# Patient Record
Sex: Male | Born: 2000 | Race: Black or African American | Hispanic: No | Marital: Single | Smoking: Never smoker
Health system: Southern US, Community
[De-identification: ages and names within clinical notes are randomized; demographics above are authoritative.]

## PROBLEM LIST (undated history)

## (undated) DIAGNOSIS — Z789 Other specified health status: Secondary | ICD-10-CM

## (undated) HISTORY — PX: NO PAST SURGERIES: SHX2092

---
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
## 2018-08-25 IMAGING — US US ABDOMEN LIMITED
1 series · 14 of 25 positions shown · non-contrast
Comparison: None.

CLINICAL DATA: Elevated liver enzymes

EXAM:
ULTRASOUND ABDOMEN LIMITED RIGHT UPPER QUADRANT

[Series 1: us abdomen limited · 0.20mm/px · 14 of 41 slices shown]
[im 1/41]
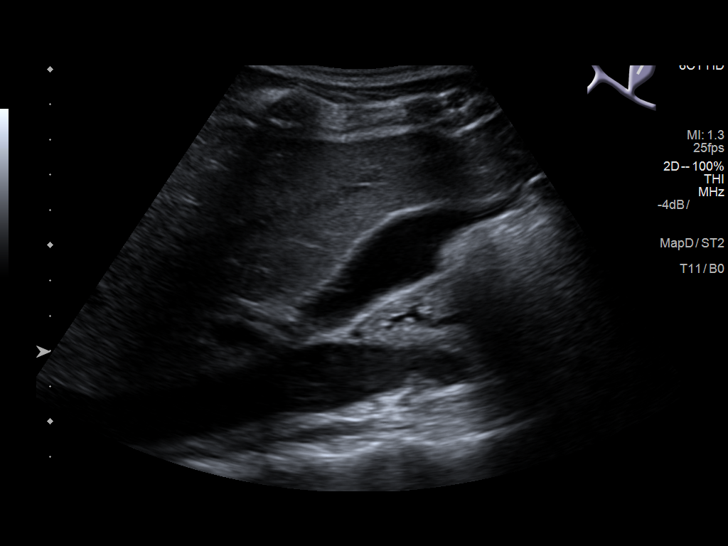
[im 4/41]
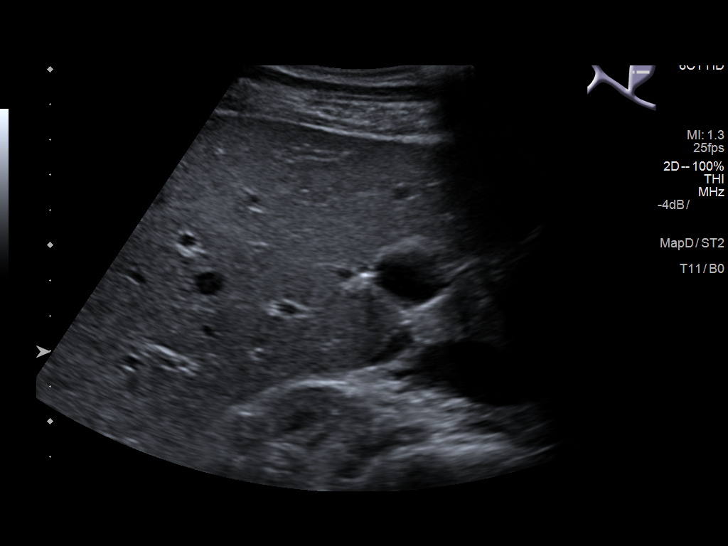
[im 7/41]
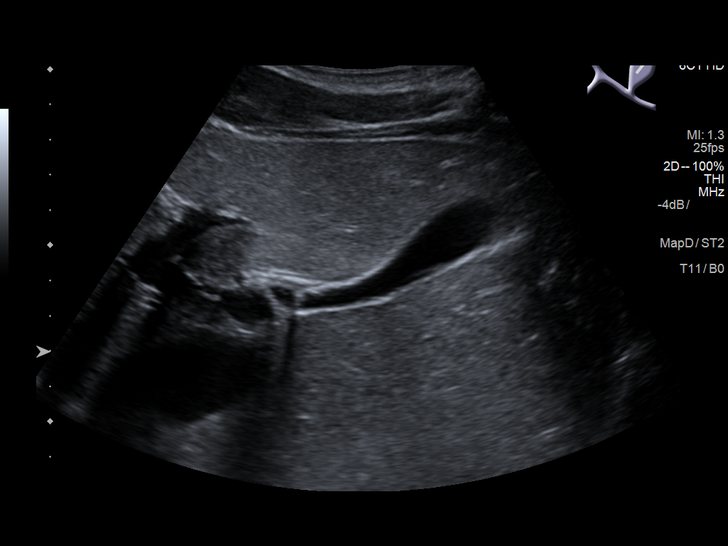
[im 11/41]
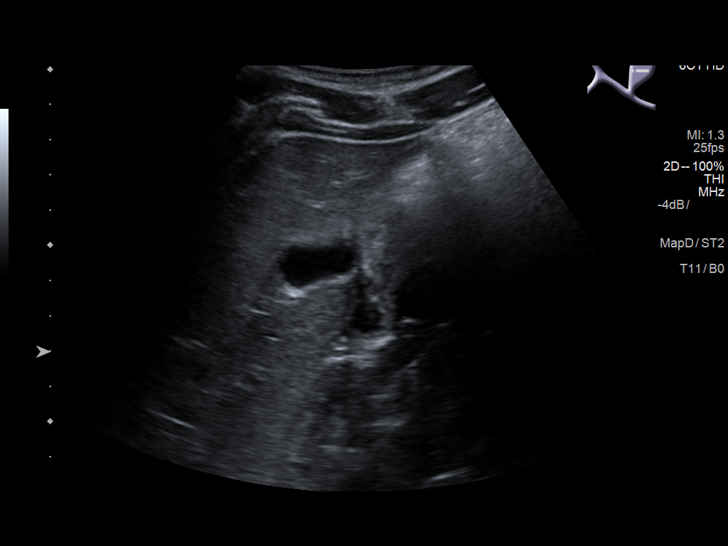
[im 14/41]
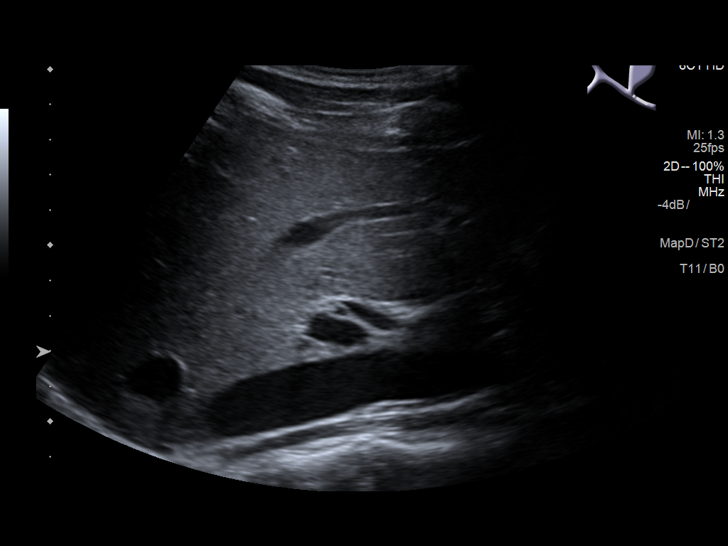
[im 16/41]
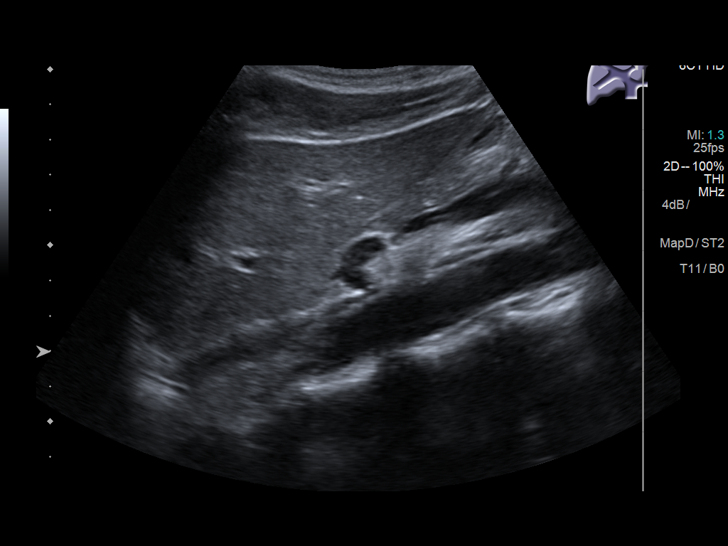
[im 19/41]
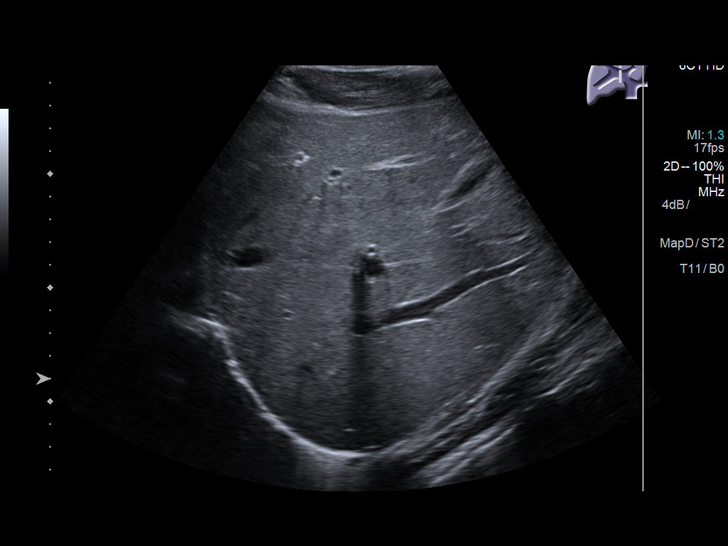
[im 22/41]
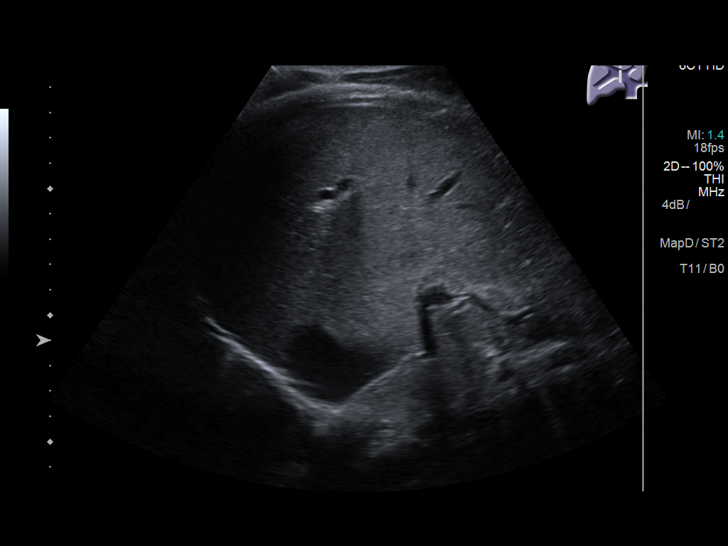
[im 26/41]
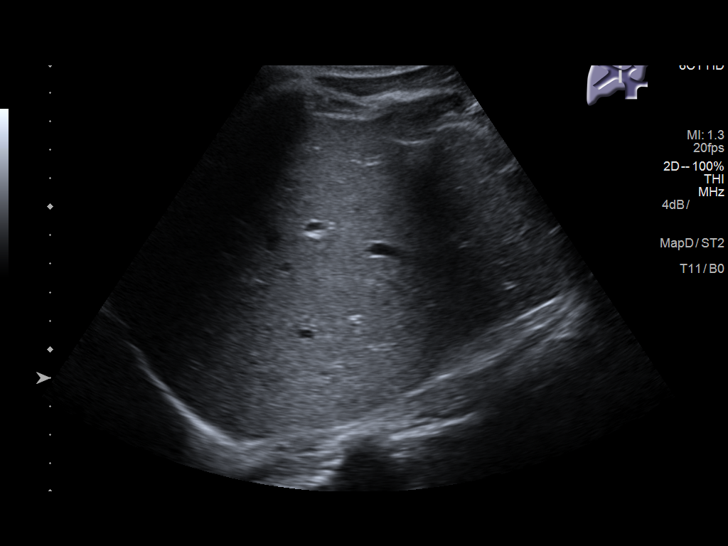
[im 27/41]
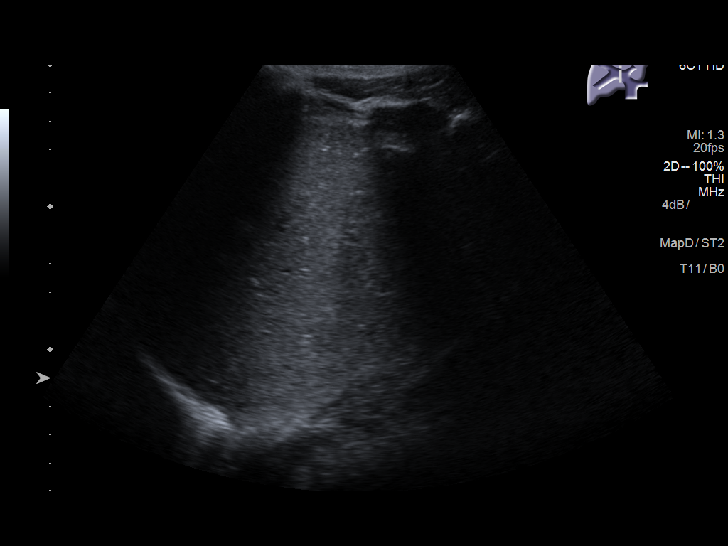
[im 31/41]
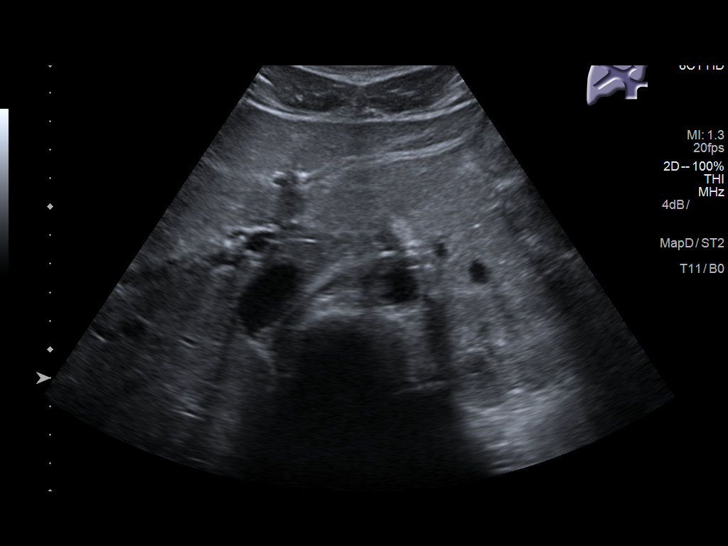
[im 34/41]
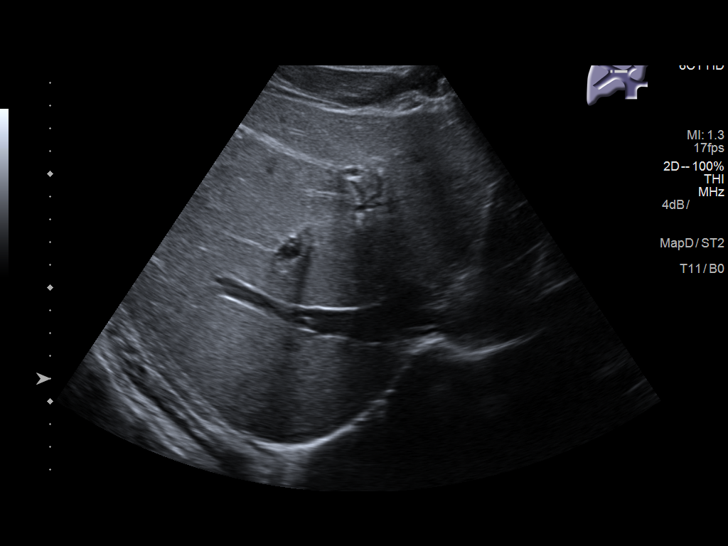
[im 37/41]
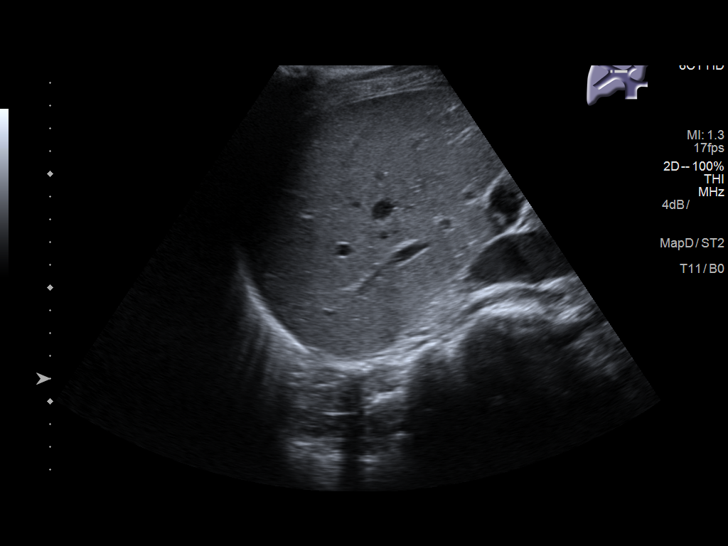
[im 41/41]
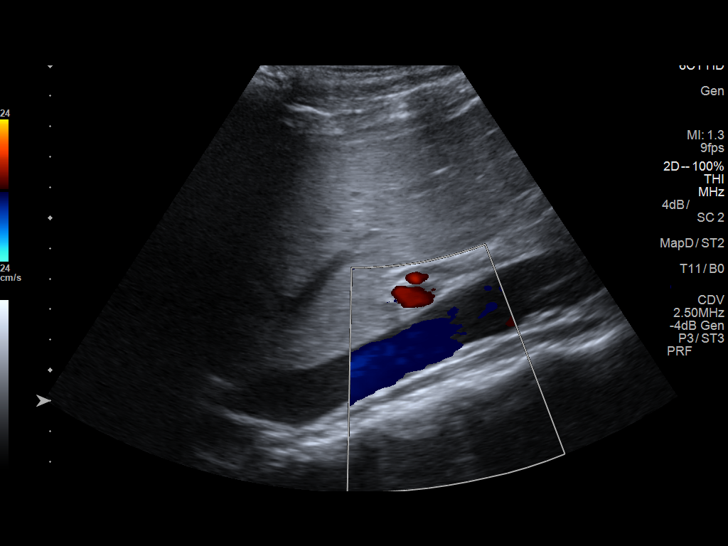

[14 of 25 positions shown; findings below may reference images not displayed]

FINDINGS: Gallbladder:

No gallstones or wall thickening visualized. There is no
pericholecystic fluid. No sonographic Murphy sign noted by
sonographer.

Common bile duct:

Diameter: 4 mm. No intrahepatic or extrahepatic biliary duct
dilatation.

Liver:

No focal lesion identified. Within normal limits in parenchymal
echogenicity. Portal vein is patent on color Doppler imaging with
normal direction of blood flow towards the liver.
IMPRESSION: Study within normal limits.

## 2023-10-16 ENCOUNTER — Inpatient Hospital Stay
Admission: EM | Admit: 2023-10-16 | Discharge: 2023-10-19 | DRG: 558 | Disposition: A | Discharge status: Home / Self Care | Attending: Internal Medicine | Admitting: Internal Medicine

## 2023-10-16 ENCOUNTER — Emergency Department

## 2023-10-16 ENCOUNTER — Other Ambulatory Visit: Payer: Self-pay

## 2023-10-16 DIAGNOSIS — M79602 Pain in left arm: Secondary | ICD-10-CM

## 2023-10-16 DIAGNOSIS — I10 Essential (primary) hypertension: Secondary | ICD-10-CM | POA: Insufficient documentation

## 2023-10-16 DIAGNOSIS — R7401 Elevation of levels of liver transaminase levels: Secondary | ICD-10-CM

## 2023-10-16 DIAGNOSIS — M7918 Myalgia, other site: Secondary | ICD-10-CM

## 2023-10-16 DIAGNOSIS — R82998 Other abnormal findings in urine: Secondary | ICD-10-CM

## 2023-10-16 DIAGNOSIS — M6282 Rhabdomyolysis: Secondary | ICD-10-CM | POA: Diagnosis not present

## 2023-10-16 DIAGNOSIS — M791 Myalgia, unspecified site: Secondary | ICD-10-CM | POA: Insufficient documentation

## 2023-10-16 DIAGNOSIS — R030 Elevated blood-pressure reading, without diagnosis of hypertension: Secondary | ICD-10-CM | POA: Diagnosis present

## 2023-10-16 DIAGNOSIS — R748 Abnormal levels of other serum enzymes: Secondary | ICD-10-CM | POA: Insufficient documentation

## 2023-10-16 DIAGNOSIS — Z8249 Family history of ischemic heart disease and other diseases of the circulatory system: Secondary | ICD-10-CM

## 2023-10-16 DIAGNOSIS — N179 Acute kidney failure, unspecified: Secondary | ICD-10-CM

## 2023-10-16 LAB — COMPREHENSIVE METABOLIC PANEL WITH GFR
ALT: 580 U/L — ABNORMAL HIGH (ref 0–44)
AST: 2486 U/L — ABNORMAL HIGH (ref 15–41)
Albumin: 4.1 g/dL (ref 3.5–5.0)
Alkaline Phosphatase: 54 U/L (ref 38–126)
Anion gap: 9 (ref 5–15)
BUN: 15 mg/dL (ref 6–20)
CO2: 28 mmol/L (ref 22–32)
Calcium: 9.3 mg/dL (ref 8.9–10.3)
Chloride: 101 mmol/L (ref 98–111)
Creatinine, Ser: 1.28 mg/dL — ABNORMAL HIGH (ref 0.61–1.24)
GFR, Estimated: >60 mL/min (ref >60)
Glucose, Bld: 108 mg/dL — ABNORMAL HIGH (ref 70–99)
Potassium: 4.2 mmol/L (ref 3.5–5.1)
Sodium: 138 mmol/L (ref 135–145)
Total Bilirubin: 0.9 mg/dL (ref 0.0–1.2)
Total Protein: 6.9 g/dL (ref 6.5–8.1)

## 2023-10-16 LAB — CBC WITH DIFFERENTIAL/PLATELET
Abs Immature Granulocytes: 0.04 10*3/uL (ref 0.00–0.07)
Basophils Absolute: 0 10*3/uL (ref 0.0–0.1)
Basophils Relative: 0 %
Eosinophils Absolute: 0.1 10*3/uL (ref 0.0–0.5)
Eosinophils Relative: 1 %
HCT: 44.5 % (ref 39.0–52.0)
Hemoglobin: 15.2 g/dL (ref 13.0–17.0)
Immature Granulocytes: 1 %
Lymphocytes Relative: 33 %
Lymphs Abs: 2.5 10*3/uL (ref 0.7–4.0)
MCH: 31.7 pg (ref 26.0–34.0)
MCHC: 34.2 g/dL (ref 30.0–36.0)
MCV: 92.7 fL (ref 80.0–100.0)
Monocytes Absolute: 0.5 10*3/uL (ref 0.1–1.0)
Monocytes Relative: 7 %
Neutro Abs: 4.6 10*3/uL (ref 1.7–7.7)
Neutrophils Relative %: 58 %
Platelets: 202 10*3/uL (ref 150–400)
RBC: 4.8 MIL/uL (ref 4.22–5.81)
RDW: 11.7 % (ref 11.5–15.5)
WBC: 7.7 10*3/uL (ref 4.0–10.5)
nRBC: 0 % (ref 0.0–0.2)

## 2023-10-16 LAB — CK: Total CK: >20000 U/L — ABNORMAL HIGH (ref 49–397)

## 2023-10-16 MED ORDER — HYDRALAZINE HCL 20 MG/ML IJ SOLN: 5.0000 mg | Freq: Four times a day (QID) | INTRAMUSCULAR | Status: DC | PRN

## 2023-10-16 MED ORDER — SENNOSIDES-DOCUSATE SODIUM 8.6-50 MG PO TABS
1.0000 | ORAL_TABLET | Freq: Every evening | ORAL | Status: DC | PRN
Start: 2023-10-16 — End: 2023-10-19

## 2023-10-16 MED ORDER — ACETAMINOPHEN 325 MG PO TABS: 650.0000 mg | ORAL_TABLET | Freq: Four times a day (QID) | ORAL | Status: DC | PRN

## 2023-10-16 MED ORDER — ACETAMINOPHEN 650 MG RE SUPP
650.0000 mg | Freq: Four times a day (QID) | RECTAL | Status: DC | PRN
Start: 2023-10-16 — End: 2023-10-21

## 2023-10-16 MED ORDER — SODIUM CHLORIDE 0.9 % IV SOLN: INTRAVENOUS | Status: DC

## 2023-10-16 MED ORDER — SODIUM CHLORIDE 0.9 % IV BOLUS
1000.0000 mL | Freq: Once | INTRAVENOUS | Status: AC
  Administered 2023-10-16: 1000 mL via INTRAVENOUS

## 2023-10-16 MED ORDER — HEPARIN SODIUM (PORCINE) 5000 UNIT/ML IJ SOLN
5000.0000 [IU] | Freq: Three times a day (TID) | INTRAMUSCULAR | Status: DC
  Filled 2023-10-16: qty 1

## 2023-10-16 MED ORDER — ONDANSETRON HCL 4 MG PO TABS: 4.0000 mg | ORAL_TABLET | Freq: Four times a day (QID) | ORAL | Status: DC | PRN

## 2023-10-16 MED ORDER — ONDANSETRON HCL 4 MG/2ML IJ SOLN: 4.0000 mg | Freq: Four times a day (QID) | INTRAMUSCULAR | Status: DC | PRN

## 2023-10-16 NOTE — ED Triage Notes (Addendum)
 Pt to ed from home via POV for brown urine x 2 days. Pt denies burning. No discharge. Pt is caox4, in no acute distress and ambulatory in triage. Pt works out a lot and thinks it might have to do with his muscles. Pt is having muscle aches too. Pt was seen previously and was in rhabdo,.

## 2023-10-16 NOTE — Assessment & Plan Note (Addendum)
 Secondary to rhabdomyolysis, repeat LFTs in the a.m. Check UDS on admission

## 2023-10-16 NOTE — Plan of Care (Signed)

## 2023-10-16 NOTE — H&P (Signed)
 History and Physical   Billy Oconnor. FMW:[id number] DOB: 05/03/2000 DOA: 10/16/2023  PCP: Patient, [name] Patient coming from: Home via POV  I have personally briefly reviewed patient's old medical records in [facility] EMR.  Chief Concern: Muscle ache, brown urine  HPI: Mr. Billy Oconnor, Billy Oconnor. is a 23 year old male with prior history of rhabdomyolysis from an intense workout who presents to the emergency department for chief concerns of muscle ache and brown urine.  Vitals in the ED showed temperature of 99, respiration rate 16, heart rate 65, blood pressure 143/46, SpO2 of 96% on room air.  Serum sodium is 138, potassium 4.2, chloride 101, bicarb 28, BUN of 15, serum creatinine 1.28, eGFR greater than 60, nonfasting blood glucose 108, WBC 7.7, hemoglobin 15.2, platelets of 202.  CK is greater than 20,000.  AST markedly elevated at 2486.  ALT elevated at 580.  ED treatment: 1 L bolus sodium chloride . ----------------------------------- At bedside, patient able to tell me his first and last name, age, location, current calendar year.  He reports that he has been working out a lot this week.  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  He reports that on Tuesday, he had a really intense upper body workout, with weightlifting.  Today, he did track and ran outside and it was very hot.  He reports he thinks that he drinks enough water however is not able to quantify to me how many ounces/liters he consumes during these workout episodes.  He denies trauma to his person.  He denies syncope, chest pain, shortness of breath, loss of consciousness.  Social history: He lives at home.  He denies tobacco and IV recreational drug use.  He occasionally consumes EtOH however has not drank anything in the last 3 months.  Yesterday he had 1 margarita.  He reports occasionally THC use, does not remember the last time he smoked marijuana.  ROS: Constitutional: no weight change, no  fever ENT/Mouth: no sore throat, no rhinorrhea Eyes: no eye pain, no vision changes Cardiovascular: no chest pain, no dyspnea,  no edema, no palpitations Respiratory: no cough, no sputum, no wheezing Gastrointestinal: no nausea, no vomiting, no diarrhea, no constipation Genitourinary: no urinary incontinence, no dysuria, no hematuria, + dark urine Musculoskeletal: no arthralgias, + myalgias Skin: no skin lesions, no pruritus, Neuro: + weakness, no loss of consciousness, no syncope Psych: no anxiety, no depression, no decrease appetite Heme/Lymph: no bruising, no bleeding  ED Course: Discussed with EDP, patient requiring hospitalization concerns of rhabdomyolysis.  Assessment/Plan  Principal Problem:   Rhabdomyolysis Active Problems:   Elevated liver enzymes   Essential hypertension   Muscle ache   Assessment and Plan:  * Rhabdomyolysis Status post sodium chloride  1 L bolus per EDP I have ordered sodium chloride  1 L bolus on admission Continue with sodium chloride  infusion with rate of 150 mL/h, 1 day ordered Strict I's and O Recheck CK in the a.m.  Muscle ache Secondary to rhabdomyolysis Continue treatment per above  Essential hypertension Hydralazine 5 mg IV every 6 hours as needed for SBP > 150, 5 days ordered  Elevated liver enzymes Secondary to rhabdomyolysis, repeat LFTs in the a.m. Check UDS on admission  Chart reviewed.   DVT prophylaxis: Heparin Code Status: full code  Diet: Regular diet Family Communication: no  Disposition Plan: Pending clinical course Consults called: None at this time Admission status: Telemetry medical, observation  Past Medical History:  Diagnosis Date   Medical history  non-contributory    Past Surgical History:  Procedure Laterality Date   NO PAST SURGERIES     Social History:  reports that he has never smoked. He has never used smokeless tobacco. He reports that he does not drink alcohol and does not use drugs.  No Known  Allergies Family History  Problem Relation Age of Onset   CAD Mother    Family history: Family history reviewed and not pertinent.  Prior to Admission medications   Medication Sig Start Date End Date Taking? Authorizing Provider  ibuprofen  (ADVIL ,MOTRIN ) 800 MG tablet Take 1 tablet (800 mg total) by mouth every 8 (eight) hours as needed. Patient not taking: Reported on 09/24/2017 06/14/16   Charlann Isaiah SQUIBB, MD   Physical Exam: Vitals:   10/16/23 1520 10/16/23 1521  BP: (!) 143/46   Pulse: 65   Resp: 16   Temp: 99 F (37.2 C)   SpO2: 96%   Height:  5' 10 (1.778 m)   Constitutional: appears age-appropriate, NAD, calm Eyes: PERRL, lids and conjunctivae normal ENMT: Mucous membranes are moist. Posterior pharynx clear of any exudate or lesions. Age-appropriate dentition. Hearing appropriate Neck: normal, supple, no masses, no thyromegaly Respiratory: clear to auscultation bilaterally, no wheezing, no crackles. Normal respiratory effort. No accessory muscle use.  Cardiovascular: Regular rate and rhythm, no murmurs / rubs / gallops. No extremity edema. 2+ pedal pulses. No carotid bruits.  Abdomen: no tenderness, no masses palpated, no hepatosplenomegaly. Bowel sounds positive.  Musculoskeletal: no clubbing / cyanosis. No joint deformity upper and lower extremities. Good ROM, no contractures, no atrophy. Normal muscle tone.  Skin: no rashes, lesions, ulcers. No induration Neurologic: Sensation intact. Strength 5/5 in all 4.  Psychiatric: Normal judgment and insight. Alert and oriented x 3. Normal mood.   EKG: Not indicated at this time  Chest x-ray on Admission: I personally reviewed and I agree with radiologist reading as below.  US  ABDOMEN LIMITED RUQ (LIVER/GB) Result Date: 10/16/2023 CLINICAL DATA:  [id number] Transaminitis [id number] EXAM: ULTRASOUND ABDOMEN LIMITED RIGHT UPPER QUADRANT COMPARISON:  None Available. FINDINGS: Gallbladder: Decompressed without gallstones no wall  thickening or pericholecystic fluid. No sonographic Murphy's sign noted by sonographer. Common bile duct: Diameter: 3 mm Liver: Normal echogenicity. No focal lesion identified. No intrahepatic biliary ductal dilation. Portal vein is patent on color Doppler imaging with normal direction of blood flow towards the liver. Right Kidney: Partially visualized. No mass. No hydronephrosis or nephrolithiasis. Other: None. IMPRESSION: No cholecystolithiasis or changes of acute cholecystitis. Electronically Signed   By: Rogelia Myers M.D.   On: 10/16/2023 17:45   Labs on Admission: I have personally reviewed following labs  CBC: Recent Labs  Lab 10/16/23 1523  WBC 7.7  NEUTROABS 4.6  HGB 15.2  HCT 44.5  MCV 92.7  PLT 202   Basic Metabolic Panel: Recent Labs  Lab 10/16/23 1523  NA 138  K 4.2  CL 101  CO2 28  GLUCOSE 108*  BUN 15  CREATININE 1.28*  CALCIUM 9.3   GFR: CrCl cannot be calculated (Unknown ideal weight.).  Liver Function Tests: Recent Labs  Lab 10/16/23 1523  AST 2,486*  ALT 580*  ALKPHOS 54  BILITOT 0.9  PROT 6.9  ALBUMIN 4.1   Cardiac Enzymes: Recent Labs  Lab 10/16/23 1523  CKTOTAL >20,000*   Urine analysis:    Component Value Date/Time   COLORURINE YELLOW (A) 09/24/2017 1801   APPEARANCEUR CLEAR (A) 09/24/2017 1801   LABSPEC 1.015 09/24/2017 1801   PHURINE 6.0 09/24/2017  1801   GLUCOSEU NEGATIVE 09/24/2017 1801   HGBUR LARGE (A) 09/24/2017 1801   BILIRUBINUR NEGATIVE 09/24/2017 1801   KETONESUR NEGATIVE 09/24/2017 1801   PROTEINUR 30 (A) 09/24/2017 1801   NITRITE NEGATIVE 09/24/2017 1801   LEUKOCYTESUR NEGATIVE 09/24/2017 1801   This document was prepared using Dragon Voice Recognition software and may include unintentional dictation errors.  Dr. Sherre [facility]  If 7PM-7AM, please contact overnight-coverage provider If 7AM-7PM, please contact day attending provider [email or web address]  10/16/2023, 5:47 PM

## 2023-10-16 NOTE — Progress Notes (Signed)
 Patient received to the unit alert and oriented. Ambulatory. Room Air. Oriented  to the room. Call light in reach. IV fluids as ordered.   Billy Oconnor [name]

## 2023-10-16 NOTE — Assessment & Plan Note (Signed)
 Hydralazine 5 mg IV every 6 hours as needed for SBP > 150, 5 days ordered

## 2023-10-16 NOTE — ED Provider Notes (Signed)
 [facility] Provider Note    Event Date/Time   First MD Initiated Contact with Patient 10/16/23 1556     (approximate)   History   Muscle Pain   HPI  Abrahim L Rayner Erman. is a 23 y.o. male presenting with upper arm aching as well as dark urine.  States that he did a very heavy workout to his arms on Tuesday, states that he noted his arms were slightly swollen after, aching, swelling has improved, he denies any weakness or numbness.  Noticed 2 days of dark urine.  He denies any abdominal pain, no lower extremity pain, no dysuria or urinary frequency.  Patient states that he was diagnosed with rhabdo and presented with similar symptoms previously and is concerned that he has rhabdo again.    On admission chart review, he was admitted in 2019 for rhabdomyolysis, CK was found to be greater than 50,000, he did not have any acute kidney injury at that time, was aggressively fluid hydrated.  At that time his AST and ALT were also elevated due to the rhabdomyolysis.  Physical Exam   Triage Vital Signs: ED Triage Vitals  Encounter Vitals Group     BP 10/16/23 1520 (!) 143/46     Girls Systolic BP Percentile --      Girls Diastolic BP Percentile --      Boys Systolic BP Percentile --      Boys Diastolic BP Percentile --      Pulse Rate 10/16/23 1520 65     Resp 10/16/23 1520 16     Temp 10/16/23 1520 99 F (37.2 C)     Temp src --      SpO2 10/16/23 1520 96 %     Weight --      Height 10/16/23 1521 5' 10 (1.778 m)     Head Circumference --      Peak Flow --      Pain Score 10/16/23 1521 0     Pain Loc --      Pain Education --      Exclude from Growth Chart --     Most recent vital signs: Vitals:   10/16/23 1520  BP: (!) 143/46  Pulse: 65  Resp: 16  Temp: 99 F (37.2 C)  SpO2: 96%     General: Awake, no distress.  CV:  Good peripheral perfusion.  Resp:  Normal effort.  No tachypnea or respiratory distress Abd:  No distention.  Soft  nontender Other:  No bony tenderness to his extremities, he does have mild tenderness to palpation to the bilateral biceps, unable to fully extend his arm and his elbow secondary to the cramping.  Bilateral radial pulses are intact, grip strength sensation is intact,  No lower extremity edema.  No swelling to the bilateral upper extremities.  Nontoxic-appearing   ED Results / Procedures / Treatments   Labs (all labs ordered are listed, but only abnormal results are displayed) Labs Reviewed  COMPREHENSIVE METABOLIC PANEL WITH GFR - Abnormal; Notable for the following components:      Result Value   Glucose, Bld 108 (*)    Creatinine, Ser 1.28 (*)    AST 2,486 (*)    ALT 580 (*)    All other components within normal limits  CK - Abnormal; Notable for the following components:   Total CK >20,000 (*)    All other components within normal limits  CBC WITH DIFFERENTIAL/PLATELET  URINALYSIS, ROUTINE W REFLEX  MICROSCOPIC      PROCEDURES:  Critical Care performed: No  Procedures   MEDICATIONS ORDERED IN ED: Medications  sodium chloride  0.9 % bolus 1,000 mL (1,000 mLs Intravenous New Bag/Given 10/16/23 1638)     IMPRESSION / MDM / ASSESSMENT AND PLAN / ED COURSE  I reviewed the triage vital signs and the nursing notes.                              Differential diagnosis includes, but is not limited to, rhabdomyolysis, electrolyte derangements, AKI, dehydration.  Will get labs, CK, UA.  Patient's presentation is most consistent with acute presentation with potential threat to life or bodily function.  Independent interpretation of labs and imaging below.  Given the elevated CK and LFTs, he will need to be admitted for further management as well as hydration.  Consulted the hospitalist who is agreeable with plan for admission and will evaluate the patient.  Shared decision making to patient and he is agreeable with this plan.  He is admitted.    Clinical Course as of 10/16/23  1654  Sat Oct 16, 2023  1620 Independent review of labs, no leukocytosis, mild AKI, AST and ALT are both elevated, T. bili is normal, CK is also elevated to more than 20,000, suspect liver enzymes are secondary to the rhabdomyolysis but will get a right upper quadrant ultrasound. [TT]    Clinical Course User Index [TT] Waymond, Lorelle Cummins, MD     FINAL CLINICAL IMPRESSION(S) / ED DIAGNOSES   Final diagnoses:  Muscle ache of extremity  Non-traumatic rhabdomyolysis  Dark urine  AKI (acute kidney injury) (HCC)  Transaminitis     Rx / DC Orders   ED Discharge Orders     None        Note:  This document was prepared using Dragon voice recognition software and may include unintentional dictation errors.     Waymond Lorelle Cummins, MD 10/16/23 [phone]

## 2023-10-16 NOTE — Assessment & Plan Note (Signed)
 Secondary to rhabdomyolysis Continue treatment per above

## 2023-10-16 NOTE — Assessment & Plan Note (Addendum)
 Status post sodium chloride  1 L bolus per EDP I have ordered sodium chloride  1 L bolus on admission Continue with sodium chloride  infusion with rate of 150 mL/h, 1 day ordered Strict I's and O Recheck CK in the a.m.

## 2023-10-16 NOTE — Hospital Course (Signed)
 Mr. Billy Oconnor, Billy Oconnor. is a 23 year old male with prior history of rhabdomyolysis from an intense workout who presents to the emergency department for chief concerns of muscle ache and brown urine.  Vitals in the ED showed temperature of 99, respiration rate 16, heart rate 65, blood pressure 143/46, SpO2 of 96% on room air.  Serum sodium is 138, potassium 4.2, chloride 101, bicarb 28, BUN of 15, serum creatinine 1.28, eGFR greater than 60, nonfasting blood glucose 108, WBC 7.7, hemoglobin 15.2, platelets of 202.  CK is greater than 20,000.  AST markedly elevated at 2486.  ALT elevated at 580.  ED treatment: 1 L bolus sodium chloride .

## 2023-10-17 DIAGNOSIS — M6282 Rhabdomyolysis: Secondary | ICD-10-CM

## 2023-10-17 DIAGNOSIS — M791 Myalgia, unspecified site: Secondary | ICD-10-CM | POA: Diagnosis present

## 2023-10-17 DIAGNOSIS — R030 Elevated blood-pressure reading, without diagnosis of hypertension: Secondary | ICD-10-CM | POA: Diagnosis present

## 2023-10-17 DIAGNOSIS — Z8249 Family history of ischemic heart disease and other diseases of the circulatory system: Secondary | ICD-10-CM | POA: Diagnosis not present

## 2023-10-17 LAB — HEPATIC FUNCTION PANEL
ALT: 513 U/L — ABNORMAL HIGH (ref 0–44)
AST: 1924 U/L — ABNORMAL HIGH (ref 15–41)
Albumin: 3.3 g/dL — ABNORMAL LOW (ref 3.5–5.0)
Alkaline Phosphatase: 45 U/L (ref 38–126)
Bilirubin, Direct: <0.1 mg/dL (ref 0.0–0.2)
Total Bilirubin: 0.6 mg/dL (ref 0.0–1.2)
Total Protein: 6.1 g/dL — ABNORMAL LOW (ref 6.5–8.1)

## 2023-10-17 LAB — BASIC METABOLIC PANEL WITH GFR
Anion gap: 6 (ref 5–15)
BUN: 14 mg/dL (ref 6–20)
CO2: 25 mmol/L (ref 22–32)
Calcium: 8.7 mg/dL — ABNORMAL LOW (ref 8.9–10.3)
Chloride: 106 mmol/L (ref 98–111)
Creatinine, Ser: 1.07 mg/dL (ref 0.61–1.24)
GFR, Estimated: >60 mL/min (ref >60)
Glucose, Bld: 102 mg/dL — ABNORMAL HIGH (ref 70–99)
Potassium: 4 mmol/L (ref 3.5–5.1)
Sodium: 137 mmol/L (ref 135–145)

## 2023-10-17 LAB — CBC
HCT: 41.4 % (ref 39.0–52.0)
Hemoglobin: 14.2 g/dL (ref 13.0–17.0)
MCH: 31.3 pg (ref 26.0–34.0)
MCHC: 34.3 g/dL (ref 30.0–36.0)
MCV: 91.4 fL (ref 80.0–100.0)
Platelets: 181 10*3/uL (ref 150–400)
RBC: 4.53 MIL/uL (ref 4.22–5.81)
RDW: 11.9 % (ref 11.5–15.5)
WBC: 6 10*3/uL (ref 4.0–10.5)
nRBC: 0 % (ref 0.0–0.2)

## 2023-10-17 LAB — ETHANOL: Alcohol, Ethyl (B): <15 mg/dL (ref <15)

## 2023-10-17 LAB — CK: Total CK: >20000 U/L — ABNORMAL HIGH (ref 49–397)

## 2023-10-17 MED ORDER — SODIUM CHLORIDE 0.9 % IV SOLN: INTRAVENOUS | Status: DC

## 2023-10-17 NOTE — Progress Notes (Addendum)
 PROGRESS NOTE    Billy Oconnor.  FMW:[id number] DOB: 02-18-2001 DOA: 10/16/2023 PCP: Patient, [name]    Assessment & Plan:   Principal Problem:   Rhabdomyolysis Active Problems:   Elevated liver enzymes   Essential hypertension   Muscle ache  Assessment and Plan: Rhabdomyolysis: nontraumatic, likely secondary to intensive work outs. W/ muscle aches. Continue on IVFs. CK is still significantly elevated. Will monitor Cr  & urine output   AKI: likely secondary to rhabdomyolysis. Continue on IVFs. Cr is trending down from day prior   Elevated BP: w/o dx of HTN. BP is currently WNL    Transaminitis: likely secondary to rhabdomyolysis. Will check ethanol level & urine drug screen      DVT prophylaxis: heparin SQ Code Status  full  Family Communication:  Disposition Plan: likely d/c back home   Level of care: Telemetry Medical  Status is: Observation The patient remains OBS appropriate and will d/c before 2 midnights.    Consultants:    Procedures:   Antimicrobials:   Subjective: Pt c/o muscle aches   Objective: Vitals:   10/16/23 1843 10/16/23 1949 10/17/23 0409 10/17/23 0736  BP: 125/61 135/67 121/62 118/68  Pulse: (!) 50 (!) 52 (!) 46 (!) 46  Resp: 16 17 18    Temp: 98.8 F (37.1 C) 98.7 F (37.1 C)  98.2 F (36.8 C)  TempSrc:  Oral  Oral  SpO2: 96% 100% 100% 100%  Height:        Intake/Output Summary (Last 24 hours) at 10/17/2023 0825 Last data filed at 10/17/2023 0458 Gross per 24 hour  Intake 1173.61 ml  Output --  Net 1173.61 ml   There were no vitals filed for this visit.  Examination:  General exam: Appears calm and comfortable  Respiratory system: Clear to auscultation. Respiratory effort normal. Cardiovascular system: S1 & S2 +. No  rubs, gallops or clicks. Gastrointestinal system: Abdomen is nondistended, soft and nontender. Normal bowel sounds heard. Central nervous system: Alert and oriented. moves all  extremities Psychiatry: Judgement and insight appear normal. Mood & affect appropriate.     Data Reviewed: I have personally reviewed following labs and imaging studies  CBC: Recent Labs  Lab 10/16/23 1523 10/17/23 0414  WBC 7.7 6.0  NEUTROABS 4.6  --   HGB 15.2 14.2  HCT 44.5 41.4  MCV 92.7 91.4  PLT 202 181   Basic Metabolic Panel: Recent Labs  Lab 10/16/23 1523 10/17/23 0414  NA 138 137  K 4.2 4.0  CL 101 106  CO2 28 25  GLUCOSE 108* 102*  BUN 15 14  CREATININE 1.28* 1.07  CALCIUM 9.3 8.7*   GFR: CrCl cannot be calculated (Unknown ideal weight.). Liver Function Tests: Recent Labs  Lab 10/16/23 1523 10/17/23 0414  AST 2,486* 1,924*  ALT 580* 513*  ALKPHOS 54 45  BILITOT 0.9 0.6  PROT 6.9 6.1*  ALBUMIN 4.1 3.3*   No results for input(s): LIPASE, AMYLASE in the last 168 hours. No results for input(s): AMMONIA in the last 168 hours. Coagulation Profile: No results for input(s): INR, PROTIME in the last 168 hours. Cardiac Enzymes: Recent Labs  Lab 10/16/23 1523 10/17/23 0414  CKTOTAL >20,000* >20,000*   BNP (last 3 results) No results for input(s): PROBNP in the last 8760 hours. HbA1C: No results for input(s): HGBA1C in the last 72 hours. CBG: No results for input(s): GLUCAP in the last 168 hours. Lipid Profile: No results for input(s): CHOL, HDL, LDLCALC, TRIG, CHOLHDL, LDLDIRECT  in the last 72 hours. Thyroid Function Tests: No results for input(s): TSH, T4TOTAL, FREET4, T3FREE, THYROIDAB in the last 72 hours. Anemia Panel: No results for input(s): VITAMINB12, FOLATE, FERRITIN, TIBC, IRON, RETICCTPCT in the last 72 hours. Sepsis Labs: No results for input(s): PROCALCITON, LATICACIDVEN in the last 168 hours.  No results found for this or any previous visit (from the past 240 hours).       Radiology Studies: US  ABDOMEN LIMITED RUQ (LIVER/GB) Result Date: 10/16/2023 CLINICAL DATA:   [id number] Transaminitis [id number] EXAM: ULTRASOUND ABDOMEN LIMITED RIGHT UPPER QUADRANT COMPARISON:  None Available. FINDINGS: Gallbladder: Decompressed without gallstones no wall thickening or pericholecystic fluid. No sonographic Murphy's sign noted by sonographer. Common bile duct: Diameter: 3 mm Liver: Normal echogenicity. No focal lesion identified. No intrahepatic biliary ductal dilation. Portal vein is patent on color Doppler imaging with normal direction of blood flow towards the liver. Right Kidney: Partially visualized. No mass. No hydronephrosis or nephrolithiasis. Other: None. IMPRESSION: No cholecystolithiasis or changes of acute cholecystitis. Electronically Signed   By: Rogelia Myers M.D.   On: 10/16/2023 17:45        Scheduled Meds:  heparin  5,000 Units Subcutaneous Q8H   Continuous Infusions:  sodium chloride  150 mL/hr at 10/16/23 2017     LOS: 0 days       Billy CHRISTELLA Pouch, MD [facility] Pager [phone]  If 7PM-7AM, please contact night-coverage [email or web address] 10/17/2023, 8:25 AM

## 2023-10-17 NOTE — Plan of Care (Signed)

## 2023-10-18 ENCOUNTER — Inpatient Hospital Stay

## 2023-10-18 DIAGNOSIS — M6282 Rhabdomyolysis: Secondary | ICD-10-CM | POA: Diagnosis not present

## 2023-10-18 LAB — COMPREHENSIVE METABOLIC PANEL WITH GFR
ALT: 520 U/L — ABNORMAL HIGH (ref 0–44)
AST: 1488 U/L — ABNORMAL HIGH (ref 15–41)
Albumin: 3.5 g/dL (ref 3.5–5.0)
Alkaline Phosphatase: 48 U/L (ref 38–126)
Anion gap: 10 (ref 5–15)
BUN: 17 mg/dL (ref 6–20)
CO2: 24 mmol/L (ref 22–32)
Calcium: 9.2 mg/dL (ref 8.9–10.3)
Chloride: 104 mmol/L (ref 98–111)
Creatinine, Ser: 1.1 mg/dL (ref 0.61–1.24)
GFR, Estimated: >60 mL/min (ref >60)
Glucose, Bld: 99 mg/dL (ref 70–99)
Potassium: 4.2 mmol/L (ref 3.5–5.1)
Sodium: 138 mmol/L (ref 135–145)
Total Bilirubin: 0.6 mg/dL (ref 0.0–1.2)
Total Protein: 6.3 g/dL — ABNORMAL LOW (ref 6.5–8.1)

## 2023-10-18 LAB — CBC
HCT: 42.1 % (ref 39.0–52.0)
Hemoglobin: 14.4 g/dL (ref 13.0–17.0)
MCH: 31.4 pg (ref 26.0–34.0)
MCHC: 34.2 g/dL (ref 30.0–36.0)
MCV: 91.9 fL (ref 80.0–100.0)
Platelets: 174 10*3/uL (ref 150–400)
RBC: 4.58 MIL/uL (ref 4.22–5.81)
RDW: 11.7 % (ref 11.5–15.5)
WBC: 6.5 10*3/uL (ref 4.0–10.5)
nRBC: 0 % (ref 0.0–0.2)

## 2023-10-18 LAB — CK: Total CK: >20000 U/L — ABNORMAL HIGH (ref 49–397)

## 2023-10-18 NOTE — Progress Notes (Signed)
 PROGRESS NOTE    Billy Oconnor.  FMW:[id number] DOB: 2000-06-17 DOA: 10/16/2023 PCP: Patient, [name]    Assessment & Plan:   Principal Problem:   Rhabdomyolysis Active Problems:   Elevated liver enzymes   Essential hypertension   Muscle ache  Assessment and Plan: Rhabdomyolysis: nontraumatic, likely secondary to intensive work outs. W/ muscle aches. Continue on IVFs. CK is still significantly elevated. Will monitor Cr & urine output   AKI: likely secondary to rhabdomyolysis. Continue on IVFs. Cr is labile.   Elevated BP: w/o dx of HTN. BP is currently WNL    Transaminitis: likely secondary to rhabdomyolysis. AST is still significantly elevated but trending down. ALT is trending up slightly today      DVT prophylaxis: heparin SQ Code Status  full  Family Communication:  Disposition Plan: likely d/c back home   Level of care: Telemetry Medical  Status is: Observation The patient remains OBS appropriate and will d/c before 2 midnights.    Consultants:    Procedures:   Antimicrobials:   Subjective: Pt c/o left arm swelling   Objective: Vitals:   10/17/23 1835 10/17/23 2002 10/18/23 0329 10/18/23 0736  BP: (!) 119/51 129/67 116/65 125/65  Pulse: (!) 56 (!) 59 (!) 47 (!) 42  Resp: 18 16 16 15   Temp: 98 F (36.7 C) 98.4 F (36.9 C) 97.9 F (36.6 C) 98.4 F (36.9 C)  TempSrc: Oral Oral Oral   SpO2: 99% 98% 98% 99%  Height:        Intake/Output Summary (Last 24 hours) at 10/18/2023 0815 Last data filed at 10/17/2023 2344 Gross per 24 hour  Intake 1055.09 ml  Output --  Net 1055.09 ml   There were no vitals filed for this visit.  Examination:  General exam: appears comfortable  Respiratory system: clear breath sounds b/l. Cardiovascular system: S1/S2+. No rubs or clicks  Gastrointestinal system: Abd is soft, NT, ND & normal bowel sounds Central nervous system: alert & oriented. Moves all extremities  Psychiatry: Judgement and insight  appears normal. Appropriate mood and affect    Data Reviewed: I have personally reviewed following labs and imaging studies  CBC: Recent Labs  Lab 10/16/23 1523 10/17/23 0414 10/18/23 0327  WBC 7.7 6.0 6.5  NEUTROABS 4.6  --   --   HGB 15.2 14.2 14.4  HCT 44.5 41.4 42.1  MCV 92.7 91.4 91.9  PLT 202 181 174   Basic Metabolic Panel: Recent Labs  Lab 10/16/23 1523 10/17/23 0414 10/18/23 0327  NA 138 137 138  K 4.2 4.0 4.2  CL 101 106 104  CO2 28 25 24   GLUCOSE 108* 102* 99  BUN 15 14 17   CREATININE 1.28* 1.07 1.10  CALCIUM 9.3 8.7* 9.2   GFR: CrCl cannot be calculated (Unknown ideal weight.). Liver Function Tests: Recent Labs  Lab 10/16/23 1523 10/17/23 0414 10/18/23 0327  AST 2,486* 1,924* 1,488*  ALT 580* 513* 520*  ALKPHOS 54 45 48  BILITOT 0.9 0.6 0.6  PROT 6.9 6.1* 6.3*  ALBUMIN 4.1 3.3* 3.5   No results for input(s): LIPASE, AMYLASE in the last 168 hours. No results for input(s): AMMONIA in the last 168 hours. Coagulation Profile: No results for input(s): INR, PROTIME in the last 168 hours. Cardiac Enzymes: Recent Labs  Lab 10/16/23 1523 10/17/23 0414 10/18/23 0327  CKTOTAL >20,000* >20,000* >20,000*   BNP (last 3 results) No results for input(s): PROBNP in the last 8760 hours. HbA1C: No results for input(s): HGBA1C  in the last 72 hours. CBG: No results for input(s): GLUCAP in the last 168 hours. Lipid Profile: No results for input(s): CHOL, HDL, LDLCALC, TRIG, CHOLHDL, LDLDIRECT in the last 72 hours. Thyroid Function Tests: No results for input(s): TSH, T4TOTAL, FREET4, T3FREE, THYROIDAB in the last 72 hours. Anemia Panel: No results for input(s): VITAMINB12, FOLATE, FERRITIN, TIBC, IRON, RETICCTPCT in the last 72 hours. Sepsis Labs: No results for input(s): PROCALCITON, LATICACIDVEN in the last 168 hours.  No results found for this or any previous visit (from the past 240 hours).        Radiology Studies: US  ABDOMEN LIMITED RUQ (LIVER/GB) Result Date: 10/16/2023 CLINICAL DATA:  [id number] Transaminitis [id number] EXAM: ULTRASOUND ABDOMEN LIMITED RIGHT UPPER QUADRANT COMPARISON:  None Available. FINDINGS: Gallbladder: Decompressed without gallstones no wall thickening or pericholecystic fluid. No sonographic Murphy's sign noted by sonographer. Common bile duct: Diameter: 3 mm Liver: Normal echogenicity. No focal lesion identified. No intrahepatic biliary ductal dilation. Portal vein is patent on color Doppler imaging with normal direction of blood flow towards the liver. Right Kidney: Partially visualized. No mass. No hydronephrosis or nephrolithiasis. Other: None. IMPRESSION: No cholecystolithiasis or changes of acute cholecystitis. Electronically Signed   By: Rogelia Myers M.D.   On: 10/16/2023 17:45        Scheduled Meds:  heparin  5,000 Units Subcutaneous Q8H   Continuous Infusions:  sodium chloride  100 mL/hr at 10/17/23 2311     LOS: 1 day       Anthony CHRISTELLA Pouch, MD [facility] Pager [phone]  If 7PM-7AM, please contact night-coverage [email or web address] 10/18/2023, 8:15 AM

## 2023-10-19 DIAGNOSIS — M6282 Rhabdomyolysis: Secondary | ICD-10-CM | POA: Diagnosis not present

## 2023-10-19 LAB — CBC
HCT: 41.8 % (ref 39.0–52.0)
Hemoglobin: 14.5 g/dL (ref 13.0–17.0)
MCH: 31.5 pg (ref 26.0–34.0)
MCHC: 34.7 g/dL (ref 30.0–36.0)
MCV: 90.7 fL (ref 80.0–100.0)
Platelets: 182 10*3/uL (ref 150–400)
RBC: 4.61 MIL/uL (ref 4.22–5.81)
RDW: 11.6 % (ref 11.5–15.5)
WBC: 5.7 10*3/uL (ref 4.0–10.5)
nRBC: 0 % (ref 0.0–0.2)

## 2023-10-19 LAB — COMPREHENSIVE METABOLIC PANEL WITH GFR
ALT: 448 U/L — ABNORMAL HIGH (ref 0–44)
AST: 1008 U/L — ABNORMAL HIGH (ref 15–41)
Albumin: 3.3 g/dL — ABNORMAL LOW (ref 3.5–5.0)
Alkaline Phosphatase: 45 U/L (ref 38–126)
Anion gap: 8 (ref 5–15)
BUN: 20 mg/dL (ref 6–20)
CO2: 26 mmol/L (ref 22–32)
Calcium: 9.2 mg/dL (ref 8.9–10.3)
Chloride: 106 mmol/L (ref 98–111)
Creatinine, Ser: 1.02 mg/dL (ref 0.61–1.24)
GFR, Estimated: >60 mL/min (ref >60)
Glucose, Bld: 102 mg/dL — ABNORMAL HIGH (ref 70–99)
Potassium: 4.2 mmol/L (ref 3.5–5.1)
Sodium: 140 mmol/L (ref 135–145)
Total Bilirubin: 0.7 mg/dL (ref 0.0–1.2)
Total Protein: 6 g/dL — ABNORMAL LOW (ref 6.5–8.1)

## 2023-10-19 LAB — CK: Total CK: >20000 U/L — ABNORMAL HIGH (ref 49–397)

## 2023-10-19 MED ORDER — ACETAMINOPHEN 325 MG PO TABS: 650.0000 mg | ORAL_TABLET | Freq: Four times a day (QID) | ORAL | Status: AC | PRN

## 2023-10-19 NOTE — TOC Transition Note (Signed)
 Transition of Care [facility]) - Discharge Note   Patient Details  Name: Billy Oconnor. MRN: [id number] Date of Birth: [birth year]  Transition of Care [facility]) CM/SW Contact:  [name], LCSW Phone Number: 10/19/2023, 12:57 PM   Clinical Narrative:   Patient has orders to discharge home today. CSW provided packet for free/low-cost healthcare in [place] in case Medicaid sets him up with one of those clinics. No further concerns. His sister will transport him home. CSW signing off.  Final next level of care: Home/Self Care Barriers to Discharge: Barriers Resolved   Patient Goals and CMS Choice            Discharge Placement                Patient to be transferred to facility by: Sister   Patient and family notified of of transfer: 10/19/23  Discharge Plan and Services Additional resources added to the After Visit Summary for                                       Social Drivers of Health (SDOH) Interventions SDOH Screenings   Food Insecurity: No Food Insecurity (10/16/2023)  Housing: Low Risk  (10/16/2023)  Transportation Needs: No Transportation Needs (10/16/2023)  Utilities: Not At Risk (10/16/2023)  Financial Resource Strain: Not on File (12/02/2021)   Received from [facility]  Physical Activity: Not on File (12/02/2021)   Received from [facility]  Social Connections: Not on File (01/03/2023)   Received from [facility]  Stress: Not on File (12/02/2021)   Received from [facility]  Tobacco Use: Low Risk  (10/16/2023)     Readmission Risk Interventions     No data to display

## 2023-10-19 NOTE — Plan of Care (Signed)

## 2023-10-19 NOTE — TOC Progression Note (Signed)
 Transition of Care (TOC) - Progression Note    Patient Details  Name: Billy Oconnor. MRN: [id number] Date of Birth: 2000-05-09  Transition of Care [facility]) CM/SW Contact  Dalia GORMAN Fuse, RN Phone Number: 10/19/2023, 11:35 AM  Clinical Narrative:    TOC spoke with the patient in his room about ensuring he has a follow-up appointment with his PCP. Per the patient he hasn't had a PCP visit in many years. 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 TOC informed the patient that he can outreach to his health plan payor and request his assigned PCP from his case manager.  TOC provided the patient with the name of the health plan, subscriber ID, and phone number for [facility] Medicaid [place] [employer] and encouraged the member to call to get the PCP information to schedule a follow-up appointment.         Expected Discharge Plan and Services                                               Social Determinants of Health (SDOH) Interventions SDOH Screenings   Food Insecurity: No Food Insecurity (10/16/2023)  Housing: Low Risk  (10/16/2023)  Transportation Needs: No Transportation Needs (10/16/2023)  Utilities: Not At Risk (10/16/2023)  Financial Resource Strain: Not on File (12/02/2021)   Received from [facility]  Physical Activity: Not on File (12/02/2021)   Received from [facility]  Social Connections: Not on File (01/03/2023)   Received from [facility]  Stress: Not on File (12/02/2021)   Received from [facility]  Tobacco Use: Low Risk  (10/16/2023)    Readmission Risk Interventions     No data to display

## 2023-10-19 NOTE — Plan of Care (Signed)
  Problem: Education: Goal: Knowledge of General Education information will improve Description: Including pain rating scale, medication(s)/side effects and non-pharmacologic comfort measures Outcome: Progressing   Problem: Health Behavior/Discharge Planning: Goal: Ability to manage health-related needs will improve Outcome: Progressing   Problem: Clinical Measurements: Goal: Ability to maintain clinical measurements within normal limits will improve Outcome: Progressing Goal: Will remain free from infection Outcome: Progressing Goal: Diagnostic test results will improve Outcome: Progressing Goal: Respiratory complications will improve Outcome: Progressing Goal: Cardiovascular complication will be avoided Outcome: Progressing   Problem: Activity: Goal: Risk for activity intolerance will decrease Outcome: Progressing   Problem: Nutrition: Goal: Adequate nutrition will be maintained Outcome: Progressing   Problem: Coping: Goal: Level of anxiety will decrease Outcome: Progressing   Problem: Elimination: Goal: Will not experience complications related to bowel motility Outcome: Progressing Goal: Will not experience complications related to urinary retention Outcome: Progressing   Problem: Pain Managment: Goal: General experience of comfort will improve and/or be controlled Outcome: Progressing   Problem: Safety: Goal: Ability to remain free from injury will improve Outcome: Progressing   Problem: Skin Integrity: Goal: Risk for impaired skin integrity will decrease Outcome: Progressing   Problem: Health Behavior/Discharge Planning: Goal: Ability to manage health-related needs will improve Outcome: Progressing   Problem: Clinical Measurements: Goal: Ability to maintain clinical measurements within normal limits will improve Outcome: Progressing   Problem: Clinical Measurements: Goal: Will remain free from infection Outcome: Progressing   Problem: Clinical  Measurements: Goal: Diagnostic test results will improve Outcome: Progressing

## 2023-10-19 NOTE — Progress Notes (Signed)
 Discharge instructions were reviewed with patient. Questions were encouraged and answered. IV was removed. Belongings collected by patient.

## 2023-10-19 NOTE — Discharge Summary (Addendum)
 Physician Discharge Summary  Chou LITTIE Shlomo Raddle. FMW:[id number] DOB: 2000-10-18 DOA: 10/16/2023  PCP: Patient, [name]  Admit date: 10/16/2023 Discharge date: 10/19/2023  Admitted From: home Disposition:  home   Recommendations for Outpatient Follow-up:  Follow up with PCP w/in 1 week  Need to get BMP to check Cr level, CK to make sure level is trending (you have rhabdomyolysis), and CMP to check liver function tests as those were elevated as well  Home Health: no Equipment/Devices:  Discharge Condition: stable  CODE STATUS: full  Diet recommendation: Regular  Brief/Interim Summary: HPI was taken from Dr. Sherre: Mr. Deondra Wigger, Raddle. is a 23 year old male with prior history of rhabdomyolysis from an intense workout who presents to the emergency department for chief concerns of muscle ache and brown urine.   Vitals in the ED showed temperature of 99, respiration rate 16, heart rate 65, blood pressure 143/46, SpO2 of 96% on room air.   Serum sodium is 138, potassium 4.2, chloride 101, bicarb 28, BUN of 15, serum creatinine 1.28, eGFR greater than 60, nonfasting blood glucose 108, WBC 7.7, hemoglobin 15.2, platelets of 202.   CK is greater than 20,000.  AST markedly elevated at 2486.  ALT elevated at 580.   ED treatment: 1 L bolus sodium chloride . ----------------------------------- At bedside, patient able to tell me his first and last name, age, location, current calendar year.   He reports that he has been working out a lot this week.  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  He reports that on Tuesday, he had a really intense upper body workout, with weightlifting.   Today, he did track and ran outside and it was very hot.   He reports he thinks that he drinks enough water however is not able to quantify to me how many ounces/liters he consumes during these workout episodes.  He denies trauma to his person.  He denies syncope, chest pain, shortness of breath, loss of  consciousness.  Discharge Diagnoses:  Principal Problem:   Rhabdomyolysis Active Problems:   Elevated liver enzymes   Essential hypertension   Muscle ache Rhabdomyolysis: nontraumatic, likely secondary to intensive work outs. W/ muscle aches. Continue on IVFs. CK is still significantly elevated. Will monitor Cr & urine output   AKI: likely secondary to rhabdomyolysis. Continue on IVFs. Cr is trending down today   Elevated BP: w/o dx of HTN. BP is currently WNL    Transaminitis: likely secondary to rhabdomyolysis. AST, ALT are both still elevated but trending down   Discharge Instructions  Discharge Instructions     Diet general   Complete by: As directed    Discharge instructions   Complete by: As directed    F/u w/ PCP w/in 1 week. Need to get BMP to check Cr level, CK to make sure level is trending (you have rhabdomyolysis), and CMP to check liver function tests as those were elevated as well. Stay well hydrated.   Increase activity slowly   Complete by: As directed       Allergies as of 10/19/2023   No Known Allergies      Medication List     STOP taking these medications    naproxen sodium 220 MG tablet Commonly known as: ALEVE       TAKE these medications    acetaminophen  325 MG tablet Commonly known as: TYLENOL  Take 2 tablets (650 mg total) by mouth every 6 (six) hours as needed for mild pain (pain  score 1-3), fever, headache or moderate pain (pain score 4-6) (or Fever >/= 101).        No Known Allergies  Consultations:    Procedures/Studies: US  Venous Img Upper Uni Left (DVT) Result Date: 10/18/2023 CLINICAL DATA:  LEFT arm swelling for 5 days EXAM: LEFT UPPER EXTREMITY VENOUS DOPPLER ULTRASOUND TECHNIQUE: Gray-scale sonography with graded compression, as well as color Doppler and duplex ultrasound were performed to evaluate the upper extremity deep venous system from the level of the subclavian vein and including the jugular, axillary, basilic,  radial, ulnar and upper cephalic vein. Spectral Doppler was utilized to evaluate flow at rest and with distal augmentation maneuvers. COMPARISON:  None available FINDINGS: Contralateral Subclavian Vein: Respiratory phasicity is normal and symmetric with the symptomatic side. No evidence of thrombus. Normal compressibility. Internal Jugular Vein: No evidence of thrombus. Normal compressibility, respiratory phasicity and response to augmentation. Subclavian Vein: No evidence of thrombus. Normal compressibility, respiratory phasicity and response to augmentation. Axillary Vein: No evidence of thrombus. Normal compressibility, respiratory phasicity and response to augmentation. Cephalic Vein: No evidence of thrombus. Normal compressibility, respiratory phasicity and response to augmentation. Basilic Vein: No evidence of thrombus. Normal compressibility, respiratory phasicity and response to augmentation. Brachial Veins: No evidence of thrombus. Normal compressibility, respiratory phasicity and response to augmentation. Radial Veins: No evidence of thrombus. Normal compressibility, respiratory phasicity and response to augmentation. Ulnar Veins: No evidence of thrombus. Normal compressibility, respiratory phasicity and response to augmentation. Venous Reflux:  None visualized. Other Findings:  None visualized. IMPRESSION: No evidence of DVT within the left upper extremity. Electronically Signed   By: Aliene Lloyd M.D.   On: 10/18/2023 16:23   US  ABDOMEN LIMITED RUQ (LIVER/GB) Result Date: 10/16/2023 CLINICAL DATA:  [id number] Transaminitis [id number] EXAM: ULTRASOUND ABDOMEN LIMITED RIGHT UPPER QUADRANT COMPARISON:  None Available. FINDINGS: Gallbladder: Decompressed without gallstones no wall thickening or pericholecystic fluid. No sonographic Murphy's sign noted by sonographer. Common bile duct: Diameter: 3 mm Liver: Normal echogenicity. No focal lesion identified. No intrahepatic biliary ductal dilation. Portal vein is  patent on color Doppler imaging with normal direction of blood flow towards the liver. Right Kidney: Partially visualized. No mass. No hydronephrosis or nephrolithiasis. Other: None. IMPRESSION: No cholecystolithiasis or changes of acute cholecystitis. Electronically Signed   By: Rogelia Myers M.D.   On: 10/16/2023 17:45   (Echo, Carotid, EGD, Colonoscopy, ERCP)    Subjective: Pt c/o fatigue    Discharge Exam: Vitals:   10/19/23 0254 10/19/23 0755  BP: 118/63 (!) 112/37  Pulse: (!) 53 (!) 44  Resp: 17 16  Temp: 97.8 F (36.6 C) 97.7 F (36.5 C)  SpO2: 100% 100%   Vitals:   10/18/23 1626 10/18/23 1949 10/19/23 0254 10/19/23 0755  BP: 125/60 136/63 118/63 (!) 112/37  Pulse: (!) 55 (!) 55 (!) 53 (!) 44  Resp: 18 17 17 16   Temp: 98.8 F (37.1 C) 98.9 F (37.2 C) 97.8 F (36.6 C) 97.7 F (36.5 C)  TempSrc: Oral Oral Oral Oral  SpO2: 100% 99% 100% 100%  Height:        General: Pt is alert, awake, not in acute distress Cardiovascular:S1/S2 +, no rubs, no gallops Respiratory: CTA bilaterally, no wheezing, no rhonchi Abdominal: Soft, NT, ND, bowel sounds + Extremities: no edema, no cyanosis    The results of significant diagnostics from this hospitalization (including imaging, microbiology, ancillary and laboratory) are listed below for reference.     Microbiology: No results found for this or any previous visit (  from the past 240 hours).   Labs: BNP (last 3 results) No results for input(s): BNP in the last 8760 hours. Basic Metabolic Panel: Recent Labs  Lab 10/16/23 1523 10/17/23 0414 10/18/23 0327 10/19/23 0327  NA 138 137 138 140  K 4.2 4.0 4.2 4.2  CL 101 106 104 106  CO2 28 25 24 26   GLUCOSE 108* 102* 99 102*  BUN 15 14 17 20   CREATININE 1.28* 1.07 1.10 1.02  CALCIUM 9.3 8.7* 9.2 9.2   Liver Function Tests: Recent Labs  Lab 10/16/23 1523 10/17/23 0414 10/18/23 0327 10/19/23 0327  AST 2,486* 1,924* 1,488* 1,008*  ALT 580* 513* 520* 448*   ALKPHOS 54 45 48 45  BILITOT 0.9 0.6 0.6 0.7  PROT 6.9 6.1* 6.3* 6.0*  ALBUMIN 4.1 3.3* 3.5 3.3*   No results for input(s): LIPASE, AMYLASE in the last 168 hours. No results for input(s): AMMONIA in the last 168 hours. CBC: Recent Labs  Lab 10/16/23 1523 10/17/23 0414 10/18/23 0327 10/19/23 0327  WBC 7.7 6.0 6.5 5.7  NEUTROABS 4.6  --   --   --   HGB 15.2 14.2 14.4 14.5  HCT 44.5 41.4 42.1 41.8  MCV 92.7 91.4 91.9 90.7  PLT 202 181 174 182   Cardiac Enzymes: Recent Labs  Lab 10/16/23 1523 10/17/23 0414 10/18/23 0327 10/19/23 0327  CKTOTAL >20,000* >20,000* >20,000* >20,000*   BNP: Invalid input(s): POCBNP CBG: No results for input(s): GLUCAP in the last 168 hours. D-Dimer No results for input(s): DDIMER in the last 72 hours. Hgb A1c No results for input(s): HGBA1C in the last 72 hours. Lipid Profile No results for input(s): CHOL, HDL, LDLCALC, TRIG, CHOLHDL, LDLDIRECT in the last 72 hours. Thyroid function studies No results for input(s): TSH, T4TOTAL, T3FREE, THYROIDAB in the last 72 hours.  Invalid input(s): FREET3 Anemia work up No results for input(s): VITAMINB12, FOLATE, FERRITIN, TIBC, IRON, RETICCTPCT in the last 72 hours. Urinalysis    Component Value Date/Time   COLORURINE YELLOW (A) 09/24/2017 1801   APPEARANCEUR CLEAR (A) 09/24/2017 1801   LABSPEC 1.015 09/24/2017 1801   PHURINE 6.0 09/24/2017 1801   GLUCOSEU NEGATIVE 09/24/2017 1801   HGBUR LARGE (A) 09/24/2017 1801   BILIRUBINUR NEGATIVE 09/24/2017 1801   KETONESUR NEGATIVE 09/24/2017 1801   PROTEINUR 30 (A) 09/24/2017 1801   NITRITE NEGATIVE 09/24/2017 1801   LEUKOCYTESUR NEGATIVE 09/24/2017 1801   Sepsis Labs Recent Labs  Lab 10/16/23 1523 10/17/23 0414 10/18/23 0327 10/19/23 0327  WBC 7.7 6.0 6.5 5.7   Microbiology No results found for this or any previous visit (from the past 240 hours).   Time coordinating discharge: 36  minutes  SIGNED:   Anthony CHRISTELLA Pouch, MD  [facility] 10/19/2023, 12:49 PM Pager   If 7PM-7AM, please contact night-coverage [email or web address]

## 2023-12-06 NOTE — Progress Notes (Unsigned)
 Cardiology Office Note   Date:  12/07/2023  ID:  Billy LITTIE Shlomo Mickey., DOB 31-Mar-2001, MRN [id number] PCP: Patient, [name]   HeartCare Providers Cardiologist:  Caron Poser, MD     History of Present Illness Billy Oconnor. is a 23 y.o. male no significant PMH who presents for further evaluation of reported syncope and collapse.  Patient was recently hospitalized at the end of June 2025 for severe rhabdomyolysis and AKI following intensive workout and dehydration which was improving at the time of discharge.  Patient reports 3 episodes of syncope over the last 2 years.  The most recent happened last July.  He had about a 2-year period without syncope before then.  He says the episodes typically happen when it is hot outside and while he is standing up.  He denies any episodes that are happening with exertion.  He denies any prodromes of palpitations or chest pain, but just remarks that he feels hot and then will sort of have a blackout episode where he comes to on the ground.  He reports that his mother died suddenly at age 56 and this was thought to be due to a heart attack.  He does note that he often feels like he does not eat and drink enough.  Relevant CVD History - Normal pediatric TTE 07/2016 [facility])   ROS: Pt denies any chest discomfort, jaw pain, arm pain, palpitations, orthopnea, PND, or LE edema.  Studies Reviewed I have independently reviewed the patient's ECG, recent blood work, and recent hospital records.  Physical Exam VS:  BP 102/68 (BP Location: Right Arm, Patient Position: Sitting, Cuff Size: Normal)   Pulse (!) 50   Ht 5' 11 (1.803 m)   Wt 169 lb 12.8 oz (77 kg)   SpO2 99%   BMI 23.68 kg/m   Orthostatic VS for the past 24 hrs (Last 3 readings):  BP- Lying Pulse- Lying BP- Sitting Pulse- Sitting BP- Standing at 0 minutes Pulse- Standing at 0 minutes BP- Standing at 3 minutes Pulse- Standing at 3 minutes  12/07/23 1536 110/62 (!) 48 114/69 51  119/75 54 115/66 63      Wt Readings from Last 3 Encounters:  12/07/23 169 lb 12.8 oz (77 kg)  09/24/17 153 lb 8 oz (69.6 kg) (53%, Z= 0.07)*  06/14/16 148 lb 14.4 oz (67.5 kg) (55%, Z= 0.12)*   * Growth percentiles are based on CDC (Boys, 2-20 Years) data.    GEN: No acute distress. NECK: No JVD; No carotid bruits. CARDIAC: RRR, no murmurs, rubs, gallops. RESPIRATORY:  Clear to auscultation. EXTREMITIES:  Warm and well-perfused. No edema.  ASSESSMENT AND PLAN Syncope and collapse Family history of sudden cardiac death Normal orthostatic VS in office today.  Patient's episodes are nonexertional and tend to happen positionally and while he is noted to be not drinking or eating enough in hot temperatures.  He has no clear cardiac prodromes.  Overall, low suspicion for cardiac etiology, though his mother did suddenly die of unexplained causes at age 1.  The episodes are fortunately not happening frequently, so a monitor is likely of little utility.  His ECG in office today was completely normal.  Plan: - Echo to rule out structural causes - Order monitor if his symptoms start happening more frequently so that we can capture an episode reliably - Encouraged aggressive hydration and solute intake to avoid hypovolemia which seems to be the most likely cause - If above workup is negative, suggest  that he establish care with a PCP to consider noncardiac causes of syncope        Dispo: RTC prn  Signed, Caron Poser, MD

## 2023-12-07 ENCOUNTER — Ambulatory Visit

## 2023-12-07 VITALS — BP 102/68 | HR 50 | Ht 71.0 in | Wt 169.8 lb

## 2023-12-07 DIAGNOSIS — R55 Syncope and collapse: Secondary | ICD-10-CM | POA: Diagnosis not present

## 2023-12-07 DIAGNOSIS — Z8241 Family history of sudden cardiac death: Secondary | ICD-10-CM

## 2023-12-07 NOTE — Patient Instructions (Addendum)
 Medication Instructions:   No medications prescribed  *If you need a refill on your cardiac medications before your next appointment, please call your pharmacy*  Lab Work:  No labs ordered today   If you have labs (blood work) drawn today and your tests are completely normal, you will receive your results only by: MyChart Message (if you have MyChart) OR A paper copy in the mail If you have any lab test that is abnormal or we need to change your treatment, we will call you to review the results.  Testing/Procedures:  Your physician has requested that you have an echocardiogram. Echocardiography is a painless test that uses sound waves to create images of your heart. It provides your doctor with information about the size and shape of your heart and how well your heart's chambers and valves are working.   You may receive an ultrasound enhancing agent through an IV if needed to better visualize your heart during the echo. This procedure takes approximately one hour.  There are no restrictions for this procedure.  This will take place at [address] [facility] [address] [facility], [place] [address]  Please note: We ask at that you not bring children with you during ultrasound (echo/ vascular) testing. Due to room size and safety concerns, children are not allowed in the ultrasound rooms during exams. Our front office staff cannot provide observation of children in our lobby area while testing is being conducted. An adult accompanying a patient to their appointment will only be allowed in the ultrasound room at the discretion of the ultrasound technician under special circumstances. We apologize for any inconvenience.   Follow-Up: At [facility], you and your health needs are our priority.  As part of our continuing mission to provide you with exceptional heart care, our providers are all part of one team.  This team includes your primary Cardiologist (physician) and Advanced  Practice Providers or APPs (Physician Assistants and Nurse Practitioners) who all work together to provide you with the care you need, when you need it.  You may follow up as needed with:  Provider:  Dr. Caron Poser  Please call [facility] at [phone] for help finding a primary care physician.   Or you may call [facility] [facility] at [phone]  [phone] [address]  Rollene Northern, FNP Randine Cheeks, MD Allena Hamilton, MD Camellia Her, MD Verneita Kettering, MD  Or you may call [facility] at [phone]  -[address]  Jon Eva, MD Manuelita Flatness, PA-C Nancyann Perry, MD Jolynn Spencer, [place] Kelly Alvah Cedar, FNP Rockie Agent, MD   We recommend signing up for the patient portal called MyChart.  Sign up information is provided on this After Visit Summary.  MyChart is used to connect with patients for Virtual Visits (Telemedicine).  Patients are able to view lab/test results, encounter notes, upcoming appointments, etc.  Non-urgent messages can be sent to your provider as well.   To learn more about what you can do with MyChart, go to [email or web address].

## 2024-01-13 ENCOUNTER — Other Ambulatory Visit

## 2024-02-16 ENCOUNTER — Other Ambulatory Visit

## 2024-04-04 ENCOUNTER — Ambulatory Visit

## 2024-05-05 ENCOUNTER — Ambulatory Visit
